# Patient Record
Sex: Female | Born: 1960 | Hispanic: No | Marital: Married | Smoking: Never smoker
Health system: Southern US, Community
[De-identification: ages and names within clinical notes are randomized; demographics above are authoritative.]

---

## 2004-03-15 ENCOUNTER — Other Ambulatory Visit: Admission: RE | Admit: 2004-03-15 | Discharge: 2004-03-15 | Payer: Self-pay | Admitting: Family Medicine

## 2004-03-30 ENCOUNTER — Encounter: Admission: RE | Admit: 2004-03-30 | Discharge: 2004-03-30 | Payer: Self-pay | Admitting: Family Medicine

## 2004-12-11 ENCOUNTER — Encounter (INDEPENDENT_AMBULATORY_CARE_PROVIDER_SITE_OTHER): Payer: Self-pay | Admitting: *Deleted

## 2004-12-11 ENCOUNTER — Inpatient Hospital Stay (HOSPITAL_COMMUNITY): Admission: RE | Admit: 2004-12-11 | Discharge: 2004-12-13 | Payer: Self-pay | Admitting: Obstetrics and Gynecology

## 2007-10-28 ENCOUNTER — Other Ambulatory Visit: Admission: RE | Admit: 2007-10-28 | Discharge: 2007-10-28 | Payer: Self-pay | Admitting: Internal Medicine

## 2007-10-30 ENCOUNTER — Encounter: Admission: RE | Admit: 2007-10-30 | Discharge: 2007-10-30 | Payer: Self-pay | Admitting: Internal Medicine

## 2007-10-30 IMAGING — MG MM SCREEN MAMMOGRAM BILATERAL
4 series · 4 of 4 positions shown · non-contrast
Comparison: none

DG SCREEN MAMMOGRAM BILATERAL
Bilateral CC and MLO view(s) were taken.

DIGITAL SCREENING MAMMOGRAM WITH CAD:
There are scattered fibroglandular densities.  No masses or malignant type calcifications are 
identified.  Compared with prior studies.

[R CC]
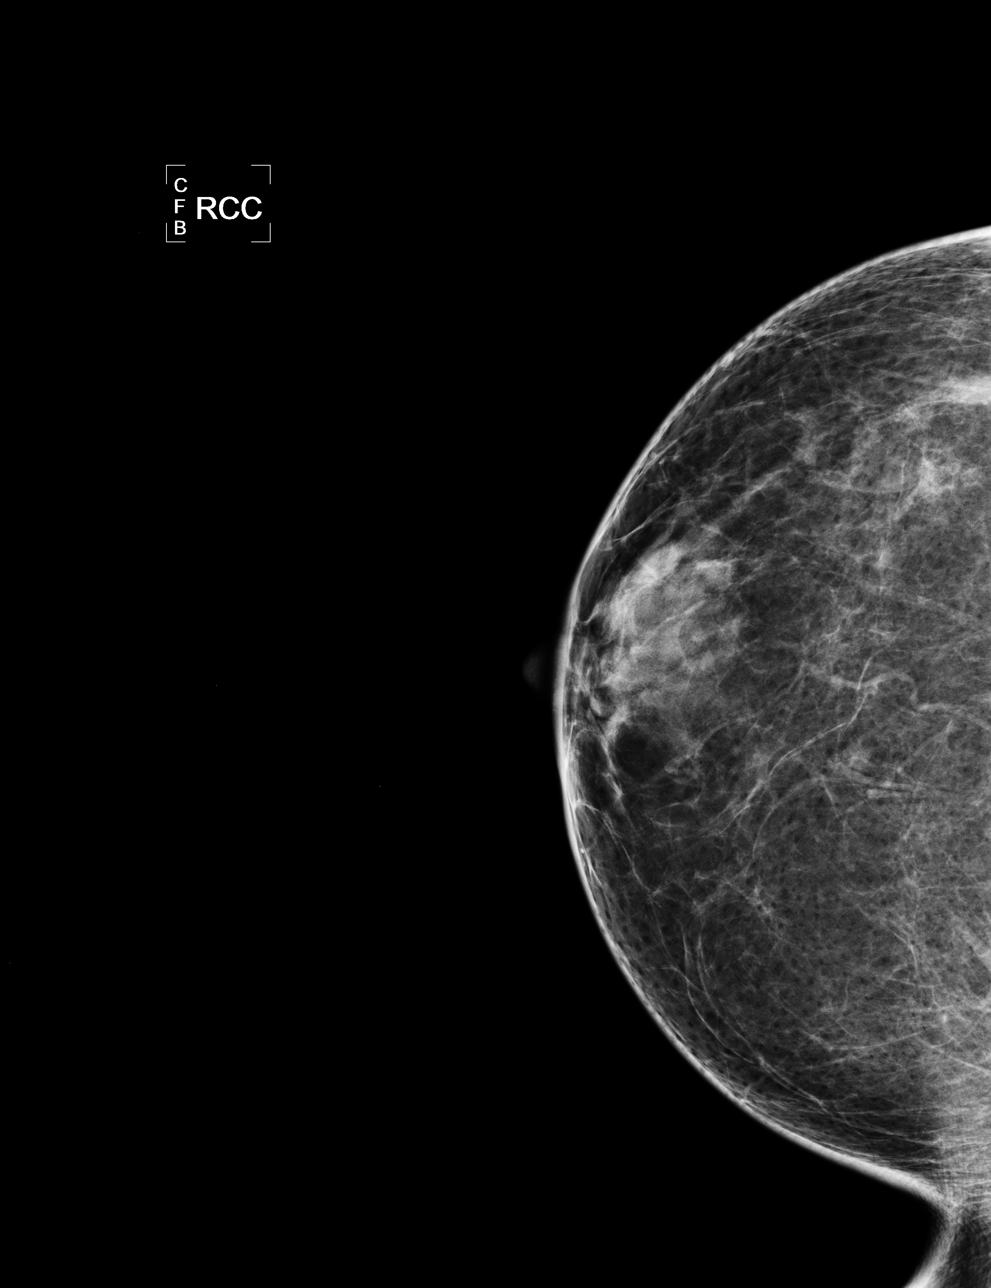

[L CC]
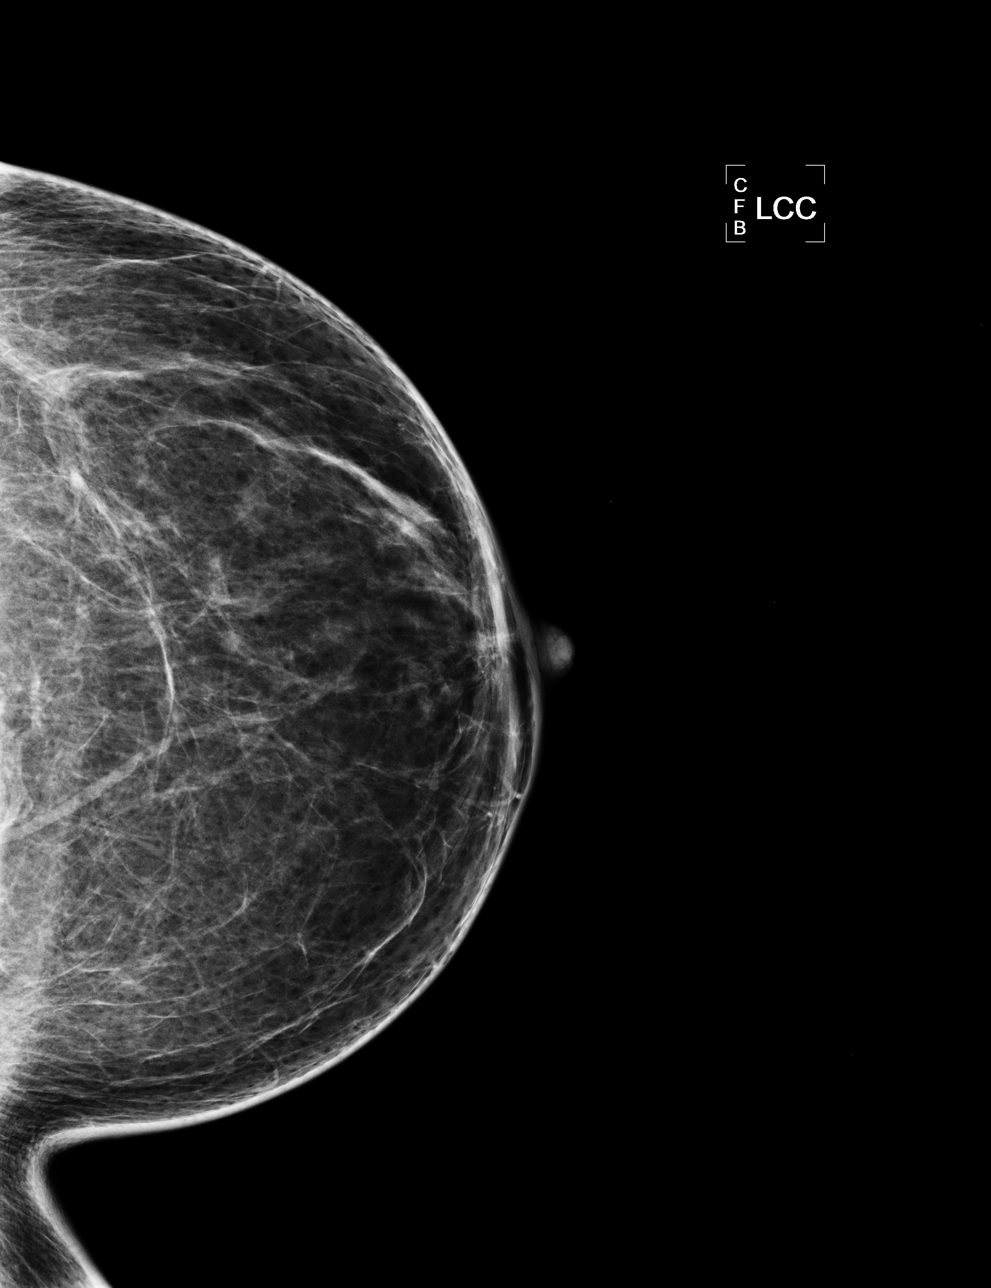

[L MLO]
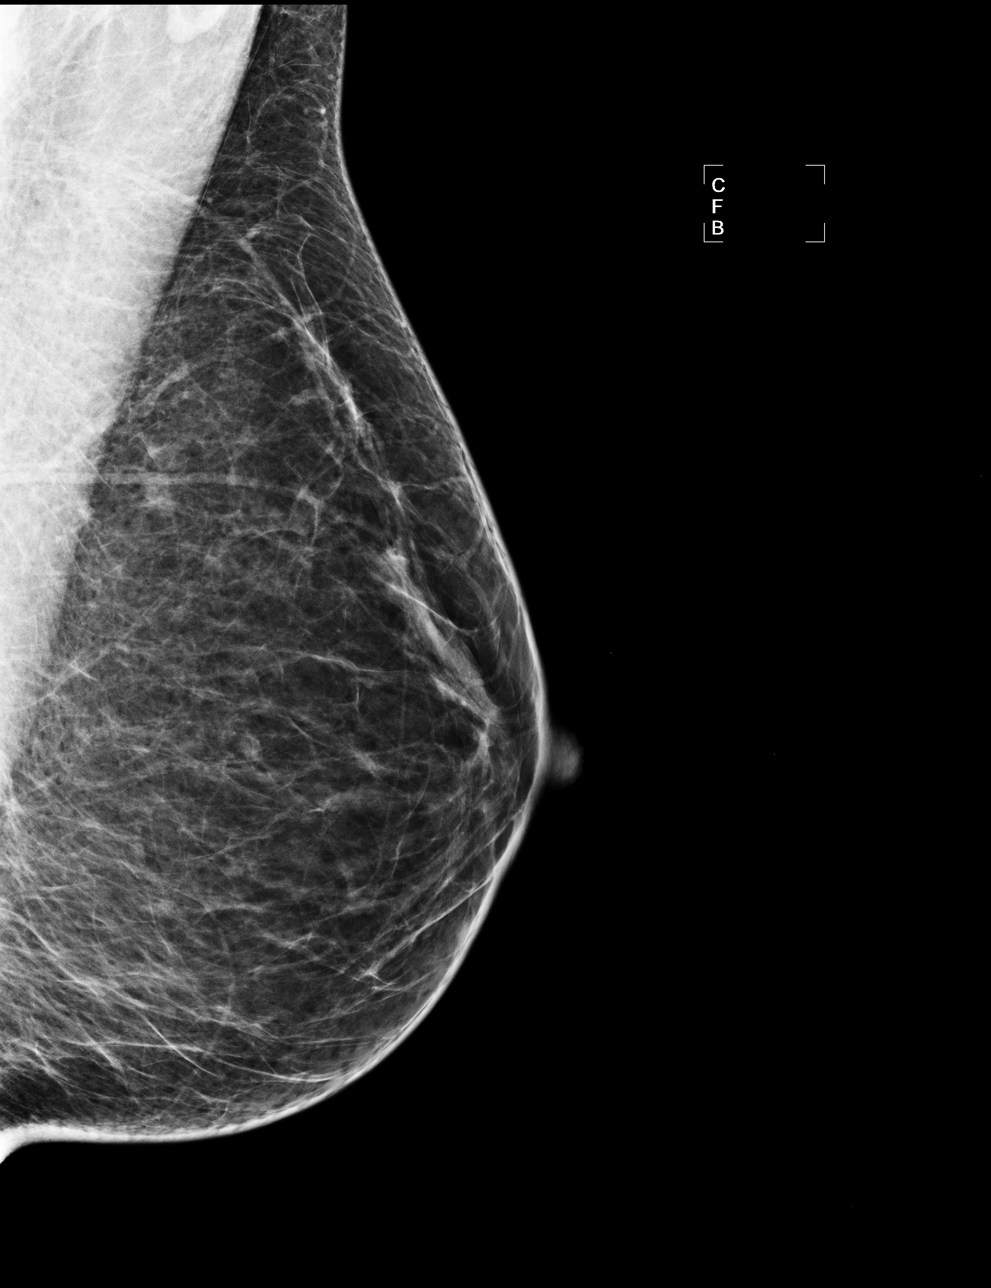

[R MLO]
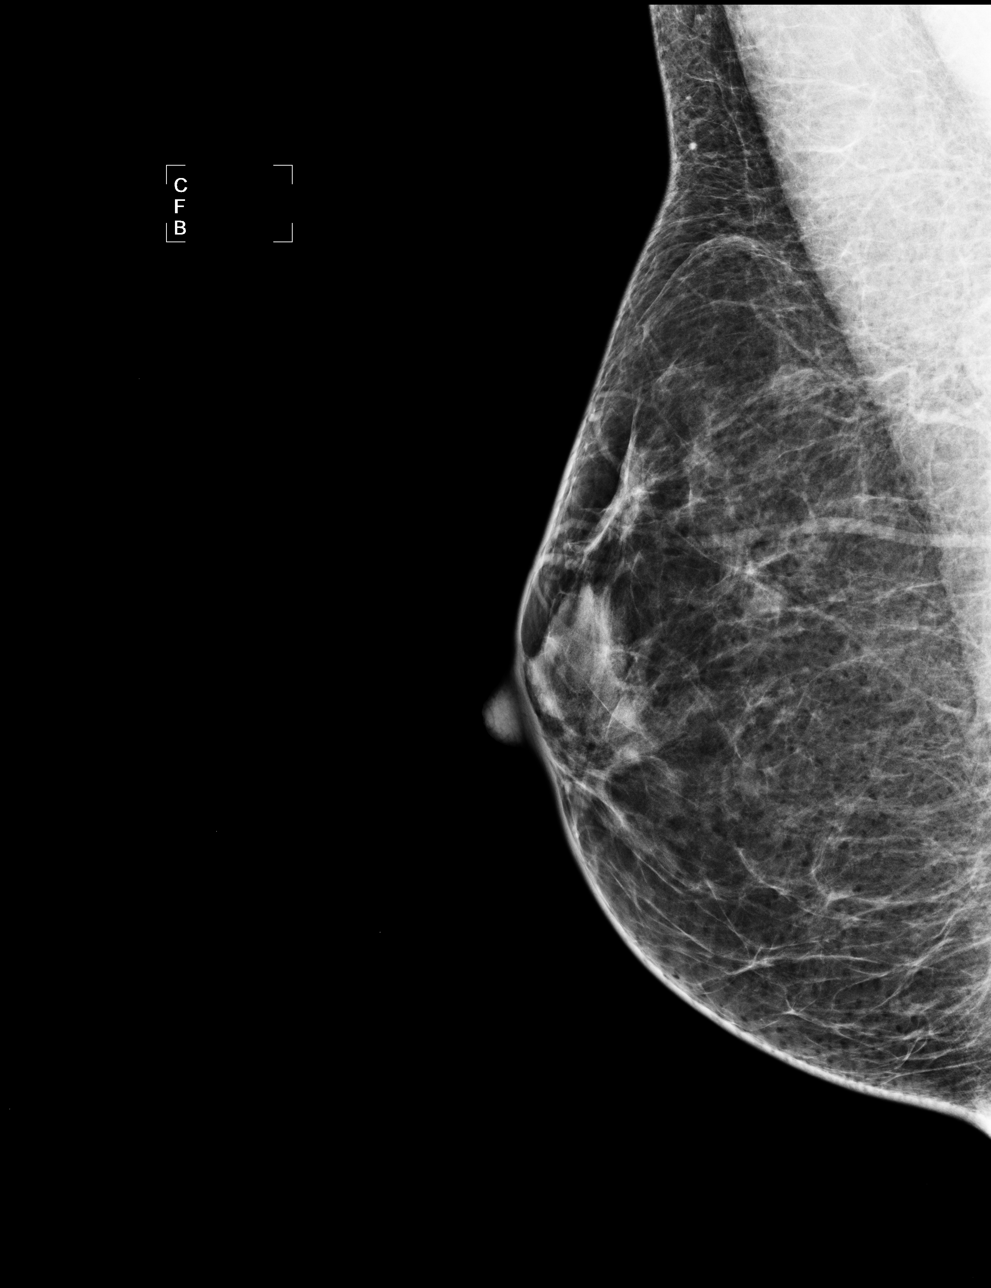

[4 of 4 positions shown; findings below may reference images not displayed]

IMPRESSION: No specific mammographic evidence of malignancy.  Next screening mammogram is recommended in one 
year.

ASSESSMENT: Negative - BI-RADS 1

Screening mammogram in 1 year.
ANALYZED BY COMPUTER AIDED DETECTION. , THIS PROCEDURE WAS A DIGITAL MAMMOGRAM.

## 2008-10-28 ENCOUNTER — Other Ambulatory Visit: Admission: RE | Admit: 2008-10-28 | Discharge: 2008-10-28 | Payer: Self-pay | Admitting: Internal Medicine

## 2010-09-10 ENCOUNTER — Encounter: Payer: Self-pay | Admitting: Internal Medicine

## 2011-01-05 NOTE — Discharge Summary (Signed)
[name], [name]            ACCOUNT NO.:  [id number]   MEDICAL RECORD NO.:  [id number]          PATIENT TYPE:  INP   LOCATION:  9302                          [facility]ACILITY:  [facility]   PHYSICIAN:  Charles A. Delcambre, MDDATE OF BIRTH:  [birth year]   DATE OF ADMISSION:  12/11/2004  DATE OF DISCHARGE:  12/13/2004                                 DISCHARGE SUMMARY   DISCHARGE DIAGNOSIS:  Uterine leiomyomata, 20 weeks size.   PROCEDURE:  Transabdominal subtotal supracervical hysterectomy, left  salpingo-oophorectomy.   DISPOSITION:  The patient is discharged home to follow up in the office in  72 hours for discontinuation of staples.  She was given convalescent  instructions to notify us for any temperature over 100 degrees, incisional  drainage, or erythema.  No lifting greater than 25 pounds for 1 month, no  driving for 2 weeks.   DISCHARGE MEDICATIONS:  1.  Prescription for Percocet 5/325 one to two p.o. q.4h p.r.n. #40.  2.  Chromagen one p.o. daily #30.   POSTOPERATIVE LABORATORY:  Pathology returned as noted above.  Benign  findings.  Uterine leiomyomata.  Hemoglobin 10.2, hematocrit 31.0.   HISTORY OF PRESENT ILLNESS:  History and physical is dictated and on the  chart.   HOSPITAL COURSE:  The patient was admitted and underwent surgery as noted  above.  Postoperatively, the patient had routine benign postoperative  course.  Catheter was discontinued on postoperative day #1.  She voided  without difficulty and flatus returned on postoperative day #1.  She was  given general diet.  She continued to do well on postoperative day #2 and  desired strongly to go home.  She tolerated pain medication and pain was  controlled.  She voided without difficulty.  She tolerated general diet.  She was ambulating and was therefore discharged home as noted above on  postoperative day #2.      CAD/MEDQ  D:  01/02/2005  T:  01/03/2005  Job:  [id number]

## 2011-01-05 NOTE — H&P (Signed)
NAMERENNIE, HACK NO.:  [id number]   MEDICAL RECORD NO.:  [id number]           PATIENT TYPE:   LOCATION:                                 FACILITY:   PHYSICIAN:  Charles A. Delcambre, MDDATE OF BIRTH:  04-Jan-1961   DATE OF ADMISSION:  DATE OF DISCHARGE:                                HISTORY & PHYSICAL   CHIEF COMPLAINT:  Large uterine fibroids with pelvic pressure, constipation,  bloating, and a large abdominal-pelvic mass.   HISTORY OF PRESENT ILLNESS:  A 50 year old para 2-0-0-2, referred initially  from Dr. Carmel Sacramento at [facility], now at [facility] at [place], with  the above-noted symptoms, much worse over the last several months, now  having trouble even zipping up her pants and now being able to feel the mass  in her abdomen.  No bleeding abnormalities but symptomatic problems as  noted, with good documented mass on a CT scan and an MRI.  Pap has been  normal.   PAST MEDICAL HISTORY:  Exercised-induced asthma and allergies, celiac  disease with dietary changes controlling.  Some diarrhea and constipation.   PAST SURGICAL HISTORY:  Lipoma removal, not specifying the region.   MEDICATIONS:  None.   ALLERGIES:  No known drug allergies.   SOCIAL HISTORY:  Occasional wine or liquor once a week.  No tobacco, ethanol  or drug use.  She is married, in a monogamous relationship with her husband.  She is a Educational psychologist at [employer].   FAMILY HISTORY:  Father is 65, with prostate cancer and obesity.  Mother is  46, in good health.  Sister 9, in good health.  Sister 63, with obesity.  Sister 31, with asthma and overweight.  Bother 31,  in good health.  Otherwise negative family history.   REVIEW OF SYSTEMS:  Negative general review.  No chest pain, shortness of  breath or wheezing.  No diarrhea, constipation or bleeding at this time.  No  urgency, frequency or dysuria or bleeding per rectum.   PHYSICAL EXAMINATION:  GENERAL:  Alert and  oriented x3, no distress.  VITAL SIGNS:  Blood pressure 98/62, respirations 16, afebrile, weight 115  pounds.  HEENT:  Grossly within normal limits.  NECK:  Supple without thyromegaly or adenopathy.  CHEST: Lungs clear bilaterally.  CARDIAC:  Regular rate and rhythm without murmur, rub, or gallop.  BREASTS:  Symmetrical, otherwise deferred.  ABDOMEN:  Soft, flat and nontender.  A palpable mass up to the umbilicus,  firm, nontender, mobile.  PELVIC:  Normal external genitalia.  Bartholin's, urethra and Skene's within  normal limits.  Vulva without discharge or lesions.  Bimanual examination:  Mass palpable up to the umbilicus, consistent with large uterine  leiomyomata, irregular, mobile.  Adnexa indeterminate secondary to the mass.   ASSESSMENT:  1.  Pelvic mass, 79.3.  2.  Uterine fibroids, 218.9.   PLAN:  Transabdominal hysterectomy.  The patient elects supracervical  hysterectomy, or subtotal hysterectomy.  In this regard, ovarian  preservation is desired as well.  She accepts risks of infection, bleeding,  bowel and bladder damage, ureteral damage, blood product risk including  hepatitis and HIV exposure, DVT risk, incisional infection, fistula  possibility.  All questions are answered and anesthesia risks were generally  discussed as well.  We will proceed as outlined, n.p.o. past midnight is  instructed.  We will have her undergo my standard bowel prep with GoLYTELY,  preoperative CBC, Chem-7, SCDs and Cefotan 1 g or equivalent will be given.  A chest x-ray or EKG per anesthesia will be done.  All questions were  answered.  Serum pregnancy test will be done as well.  We will proceed as  outlined. Gluten-free diet while hospitalized.      CAD/MEDQ  D:  12/04/2004  T:  12/04/2004  Job:  [id number]

## 2011-01-05 NOTE — Op Note (Signed)
NAMEWINFRED, REDEL            ACCOUNT NO.:  [id number]   MEDICAL RECORD NO.:  [id number]          PATIENT TYPE:  AMB   LOCATION:  SDC                           [facility]ILITY:  [facility]   PHYSICIAN:  Charles A. Delcambre, MDDATE OF BIRTH:  1960/12/21   DATE OF PROCEDURE:  12/11/2004  DATE OF DISCHARGE:                                 OPERATIVE REPORT   PREOPERATIVE DIAGNOSES:  20 week fibroids.   POSTOPERATIVE DIAGNOSES:  20 week fibroids.   PROCEDURE:  1.  Transabdominal subtotal supracervical hysterectomy.  2.  Left salpingo-oophorectomy.   SURGEON:  Charles A. Sydnee Cabal, MD   ASSISTANT:  Rudy Jew. Ashley Royalty, M.D.   COMPLICATIONS:  Some bleeding encountered in dissecting the fibroids off the  left uteroovarian pedicle with bleeding, therefore left salpingo-  oophorectomy done to achieve hemostasis.   SPECIMENS:  Uterus and left tube and ovary to pathology.   COUNTS:  Instrument, sponge and needle count correct x2.   ANESTHESIA:  General via the endotracheal route.   FINDINGS:  A 20 week uterus with multiple uterine fibroids, normal appearing  tubes and ovaries bilaterally, normal pelvis otherwise.   DESCRIPTION OF PROCEDURE:  The patient was taken to the operating room,  placed in the supine position, general anesthetic was induced without  difficulty, sterile prep and drape was undertaken. A vertical skin incision  was made with a knife up to the umbilicus and carried from the symphysis  pubis. This was carried down to the fascia, the fascia was incised with the  knife and Mayo scissors. The rectus muscles were sharply dissected in the  midline, peritoneum was entered with Metzenbaum scissors without damage to  the bowel, bladder or vascular structures. A Balfour retractor was placed,  moistened laps x3 were used to pack the bowel under the pelvis, cornual  regions were grasped with Kelly clamps, round ligaments were opened  bilaterally exposing the broad ligaments. These  were opened. The  uteroovarian pedicles were isolated on either side. On this left, this was  taken, free tied, transfixion stitch, bleeding was encountered and this was  taken back to include the ovary in order to achieve hemostasis. The ovary  was carefully isolated on the infundibulopelvic ligament pedicle well away  from the ureter which was seen clearly in the posterior wall of the pelvis  through the peritoneum while clear. Free tie and transfixion stitch of #0  Vicryl yielded good hemostasis after removal of the ovary and tube. After  taking the uteroovarian pedicle on the right, the lower uterine segment was  cleared with the bladder dropping down with some sharp dissection and blunt  dissection. The uterine vessels were taken on either side. This allowed  adequate exposure of the lower uterine segment and the cervix. The cervix  was short, lower uterine segment was short. About a centimeter and a half of  cardinal ligaments and uterosacral ligaments remained with the cervix. The  cervix was amputated from the corpus of the uterus at this point as the  patient desired supracervical hysterectomy. Judgment was made at this time  to leave the remainder of the cervix.  Zero Vicryl running figure-of-eight  sutures were used to close the cervical stump. Irrigation was carried out in  all areas of the cervical stump and pedicles were of good hemostasis. All  packs and retractors were removed. Irrigation and hemostasis was verified  once again. Subfascial hemostasis was excellent. The fascia and peritoneum  were closed en block with #0 PDS running nonlocking suture. Sterile skin  clips were applied to close the skin after minor electrocautery in the  subcutaneous layer with irrigation. A sterile dressing was applied. The  patient tolerated the procedure well and was taken to recovery with  physician in attendance.      CAD/MEDQ  D:  12/11/2004  T:  12/11/2004  Job:  [id number]

## 2011-09-18 ENCOUNTER — Other Ambulatory Visit: Payer: Self-pay | Admitting: Nurse Practitioner

## 2011-09-18 ENCOUNTER — Other Ambulatory Visit (HOSPITAL_COMMUNITY)
Admission: RE | Admit: 2011-09-18 | Discharge: 2011-09-18 | Disposition: A | Discharge status: Home / Self Care | Payer: BC Managed Care – PPO | Source: Ambulatory Visit | Attending: Obstetrics and Gynecology | Admitting: Obstetrics and Gynecology

## 2011-09-18 DIAGNOSIS — Z01419 Encounter for gynecological examination (general) (routine) without abnormal findings: Secondary | ICD-10-CM | POA: Insufficient documentation

## 2011-09-18 DIAGNOSIS — N760 Acute vaginitis: Secondary | ICD-10-CM | POA: Insufficient documentation

## 2011-09-18 DIAGNOSIS — R87810 Cervical high risk human papillomavirus (HPV) DNA test positive: Secondary | ICD-10-CM | POA: Insufficient documentation

## 2011-09-25 ENCOUNTER — Other Ambulatory Visit: Payer: Self-pay | Admitting: Nurse Practitioner

## 2011-09-25 DIAGNOSIS — Z139 Encounter for screening, unspecified: Secondary | ICD-10-CM

## 2011-10-10 ENCOUNTER — Ambulatory Visit
Admission: RE | Admit: 2011-10-10 | Discharge: 2011-10-10 | Disposition: A | Discharge status: Home / Self Care | Payer: BC Managed Care – PPO | Source: Ambulatory Visit | Attending: Nurse Practitioner | Admitting: Nurse Practitioner

## 2011-10-10 DIAGNOSIS — Z139 Encounter for screening, unspecified: Secondary | ICD-10-CM

## 2012-11-04 ENCOUNTER — Other Ambulatory Visit (HOSPITAL_COMMUNITY)
Admission: RE | Admit: 2012-11-04 | Discharge: 2012-11-04 | Disposition: A | Discharge status: Home / Self Care | Payer: BC Managed Care – PPO | Source: Ambulatory Visit | Attending: Obstetrics and Gynecology | Admitting: Obstetrics and Gynecology

## 2012-11-04 ENCOUNTER — Other Ambulatory Visit: Payer: Self-pay | Admitting: Nurse Practitioner

## 2012-11-04 DIAGNOSIS — Z1151 Encounter for screening for human papillomavirus (HPV): Secondary | ICD-10-CM | POA: Insufficient documentation

## 2012-11-04 DIAGNOSIS — Z01419 Encounter for gynecological examination (general) (routine) without abnormal findings: Secondary | ICD-10-CM | POA: Insufficient documentation

## 2012-11-04 DIAGNOSIS — N760 Acute vaginitis: Secondary | ICD-10-CM | POA: Insufficient documentation

## 2015-06-08 ENCOUNTER — Other Ambulatory Visit (HOSPITAL_COMMUNITY)
Admission: RE | Admit: 2015-06-08 | Discharge: 2015-06-08 | Disposition: A | Discharge status: Home / Self Care | Payer: BLUE CROSS/BLUE SHIELD | Source: Ambulatory Visit | Attending: Internal Medicine | Admitting: Internal Medicine

## 2015-06-08 DIAGNOSIS — Z01419 Encounter for gynecological examination (general) (routine) without abnormal findings: Secondary | ICD-10-CM | POA: Insufficient documentation

## 2016-01-13 DIAGNOSIS — N390 Urinary tract infection, site not specified: Secondary | ICD-10-CM | POA: Diagnosis not present

## 2016-01-13 DIAGNOSIS — R3915 Urgency of urination: Secondary | ICD-10-CM | POA: Diagnosis not present

## 2016-01-23 DIAGNOSIS — G4700 Insomnia, unspecified: Secondary | ICD-10-CM | POA: Diagnosis not present

## 2016-01-23 DIAGNOSIS — Z79899 Other long term (current) drug therapy: Secondary | ICD-10-CM | POA: Diagnosis not present

## 2016-01-23 DIAGNOSIS — M0600 Rheumatoid arthritis without rheumatoid factor, unspecified site: Secondary | ICD-10-CM | POA: Diagnosis not present

## 2016-01-23 DIAGNOSIS — M3500 Sicca syndrome, unspecified: Secondary | ICD-10-CM | POA: Diagnosis not present

## 2016-01-26 DIAGNOSIS — M0600 Rheumatoid arthritis without rheumatoid factor, unspecified site: Secondary | ICD-10-CM | POA: Diagnosis not present

## 2016-04-24 DIAGNOSIS — G4700 Insomnia, unspecified: Secondary | ICD-10-CM | POA: Diagnosis not present

## 2016-04-24 DIAGNOSIS — Z79899 Other long term (current) drug therapy: Secondary | ICD-10-CM | POA: Diagnosis not present

## 2016-04-24 DIAGNOSIS — M3500 Sicca syndrome, unspecified: Secondary | ICD-10-CM | POA: Diagnosis not present

## 2016-04-24 DIAGNOSIS — M0600 Rheumatoid arthritis without rheumatoid factor, unspecified site: Secondary | ICD-10-CM | POA: Diagnosis not present

## 2016-05-11 DIAGNOSIS — N390 Urinary tract infection, site not specified: Secondary | ICD-10-CM | POA: Diagnosis not present

## 2016-06-11 DIAGNOSIS — Z01419 Encounter for gynecological examination (general) (routine) without abnormal findings: Secondary | ICD-10-CM | POA: Diagnosis not present

## 2016-06-11 DIAGNOSIS — N898 Other specified noninflammatory disorders of vagina: Secondary | ICD-10-CM | POA: Diagnosis not present

## 2016-06-11 DIAGNOSIS — Z780 Asymptomatic menopausal state: Secondary | ICD-10-CM | POA: Diagnosis not present

## 2016-06-12 DIAGNOSIS — Z0000 Encounter for general adult medical examination without abnormal findings: Secondary | ICD-10-CM | POA: Diagnosis not present

## 2016-06-12 DIAGNOSIS — N390 Urinary tract infection, site not specified: Secondary | ICD-10-CM | POA: Diagnosis not present

## 2016-06-12 DIAGNOSIS — K900 Celiac disease: Secondary | ICD-10-CM | POA: Diagnosis not present

## 2016-06-12 DIAGNOSIS — E559 Vitamin D deficiency, unspecified: Secondary | ICD-10-CM | POA: Diagnosis not present

## 2016-06-12 DIAGNOSIS — M3500 Sicca syndrome, unspecified: Secondary | ICD-10-CM | POA: Diagnosis not present

## 2016-06-19 ENCOUNTER — Other Ambulatory Visit: Payer: Self-pay | Admitting: Internal Medicine

## 2016-06-19 DIAGNOSIS — Z1231 Encounter for screening mammogram for malignant neoplasm of breast: Secondary | ICD-10-CM

## 2016-06-21 DIAGNOSIS — M064 Inflammatory polyarthropathy: Secondary | ICD-10-CM | POA: Diagnosis not present

## 2016-06-21 DIAGNOSIS — K900 Celiac disease: Secondary | ICD-10-CM | POA: Diagnosis not present

## 2016-06-21 DIAGNOSIS — M3500 Sicca syndrome, unspecified: Secondary | ICD-10-CM | POA: Diagnosis not present

## 2016-06-21 DIAGNOSIS — Z23 Encounter for immunization: Secondary | ICD-10-CM | POA: Diagnosis not present

## 2016-06-21 DIAGNOSIS — Z0000 Encounter for general adult medical examination without abnormal findings: Secondary | ICD-10-CM | POA: Diagnosis not present

## 2016-07-04 ENCOUNTER — Ambulatory Visit
Admission: RE | Admit: 2016-07-04 | Discharge: 2016-07-04 | Disposition: A | Discharge status: Home / Self Care | Payer: BLUE CROSS/BLUE SHIELD | Source: Ambulatory Visit | Attending: Internal Medicine | Admitting: Internal Medicine

## 2016-07-04 DIAGNOSIS — Z1231 Encounter for screening mammogram for malignant neoplasm of breast: Secondary | ICD-10-CM | POA: Diagnosis not present

## 2016-09-03 DIAGNOSIS — M0600 Rheumatoid arthritis without rheumatoid factor, unspecified site: Secondary | ICD-10-CM | POA: Diagnosis not present

## 2016-09-03 DIAGNOSIS — Z79899 Other long term (current) drug therapy: Secondary | ICD-10-CM | POA: Diagnosis not present

## 2016-09-03 DIAGNOSIS — M3500 Sicca syndrome, unspecified: Secondary | ICD-10-CM | POA: Diagnosis not present

## 2016-10-02 DIAGNOSIS — N390 Urinary tract infection, site not specified: Secondary | ICD-10-CM | POA: Diagnosis not present

## 2016-10-03 DIAGNOSIS — H35413 Lattice degeneration of retina, bilateral: Secondary | ICD-10-CM | POA: Diagnosis not present

## 2016-10-03 DIAGNOSIS — H40013 Open angle with borderline findings, low risk, bilateral: Secondary | ICD-10-CM | POA: Diagnosis not present

## 2016-10-03 DIAGNOSIS — H532 Diplopia: Secondary | ICD-10-CM | POA: Diagnosis not present

## 2016-10-03 DIAGNOSIS — Z79899 Other long term (current) drug therapy: Secondary | ICD-10-CM | POA: Diagnosis not present

## 2016-11-30 DIAGNOSIS — R42 Dizziness and giddiness: Secondary | ICD-10-CM | POA: Diagnosis not present

## 2016-11-30 DIAGNOSIS — M19072 Primary osteoarthritis, left ankle and foot: Secondary | ICD-10-CM | POA: Diagnosis not present

## 2016-11-30 DIAGNOSIS — M0600 Rheumatoid arthritis without rheumatoid factor, unspecified site: Secondary | ICD-10-CM | POA: Diagnosis not present

## 2016-11-30 DIAGNOSIS — M3500 Sicca syndrome, unspecified: Secondary | ICD-10-CM | POA: Diagnosis not present

## 2016-11-30 DIAGNOSIS — M79671 Pain in right foot: Secondary | ICD-10-CM | POA: Diagnosis not present

## 2016-11-30 DIAGNOSIS — Z79899 Other long term (current) drug therapy: Secondary | ICD-10-CM | POA: Diagnosis not present

## 2016-11-30 DIAGNOSIS — M19071 Primary osteoarthritis, right ankle and foot: Secondary | ICD-10-CM | POA: Diagnosis not present

## 2016-12-17 ENCOUNTER — Ambulatory Visit (INDEPENDENT_AMBULATORY_CARE_PROVIDER_SITE_OTHER): Payer: BC Managed Care – PPO | Admitting: Podiatry

## 2016-12-17 ENCOUNTER — Ambulatory Visit (INDEPENDENT_AMBULATORY_CARE_PROVIDER_SITE_OTHER): Payer: BC Managed Care – PPO

## 2016-12-17 ENCOUNTER — Encounter: Payer: Self-pay | Admitting: Podiatry

## 2016-12-17 VITALS — BP 102/67 | HR 66 | Resp 16 | Ht 62.0 in | Wt 115.0 lb

## 2016-12-17 DIAGNOSIS — M2011 Hallux valgus (acquired), right foot: Secondary | ICD-10-CM | POA: Diagnosis not present

## 2016-12-17 DIAGNOSIS — G8929 Other chronic pain: Secondary | ICD-10-CM

## 2016-12-17 DIAGNOSIS — M25572 Pain in left ankle and joints of left foot: Secondary | ICD-10-CM | POA: Diagnosis not present

## 2016-12-17 DIAGNOSIS — M25571 Pain in right ankle and joints of right foot: Secondary | ICD-10-CM | POA: Diagnosis not present

## 2016-12-17 DIAGNOSIS — M779 Enthesopathy, unspecified: Secondary | ICD-10-CM

## 2016-12-17 DIAGNOSIS — M2012 Hallux valgus (acquired), left foot: Secondary | ICD-10-CM

## 2016-12-17 DIAGNOSIS — M2022 Hallux rigidus, left foot: Secondary | ICD-10-CM | POA: Diagnosis not present

## 2016-12-17 NOTE — Progress Notes (Signed)
Subjective:    Patient ID: Rose Tran, female   DOB: 56 y.o.   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 States this is been going on for years and getting worse    Review of Systems  All other systems reviewed and are negative.       Objective:  Physical Exam  Constitutional: She is oriented to person, place, and time.  Cardiovascular: Intact distal pulses.   Musculoskeletal: Normal range of motion.  Neurological: She is alert and oriented to person, place, and time.  Skin: Skin is warm.  Nursing note and vitals reviewed.  neurovascular status found to be intact muscle strength adequate range of motion within normal limits with patient found to have significant range of motion loss first MPJ left with exquisite discomfort on dorsiflexion and large dorsal spur formation. The right first MPJ shows bunion deformity with good range of motion and no indications of hallux limitus deformity. 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 At this point I do think this patient will require surgery in the left due to the severity of pain infiltrated spot previous injection and I discussed the probability for implant or fusion versus being able to reconstruct the joint. For the right I do not recommend treatment and I did go ahead today and I made orthotics for this patient as she is going to  [place] for the summer and we want to try to give her as much relief as we can prior to the trip and she can have surgery towards the end of August  X-rays indicate severe arthritis first MPJ left with dorsal spurring and significant loss of joint space with moderate bunion deformity right

## 2016-12-17 NOTE — Progress Notes (Signed)
   Subjective:    Patient ID: Rose Tran, female    DOB: 24-Nov-1960, 56 y.o.   MRN: [id number]  HPI Chief Complaint  Patient presents with  . 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 Ankle Pain    Bilateral; both sides; pt stated, "has had pain since 2011"  . Foot Orthotics    Wants to discuss      Review of Systems  Constitutional: Positive for fatigue.  HENT: Positive for sinus pain.   Eyes: Positive for itching.  Endocrine: Positive for cold intolerance.  Musculoskeletal: Positive for arthralgias and back pain.  Allergic/Immunologic: Positive for food allergies.  Neurological: Positive for dizziness and headaches.  Hematological: Bruises/bleeds easily.  All other systems reviewed and are negative.      Objective:   Physical Exam        Assessment & Plan:

## 2017-04-08 ENCOUNTER — Ambulatory Visit (INDEPENDENT_AMBULATORY_CARE_PROVIDER_SITE_OTHER): Payer: BC Managed Care – PPO | Admitting: Podiatry

## 2017-04-08 DIAGNOSIS — M2012 Hallux valgus (acquired), left foot: Secondary | ICD-10-CM | POA: Diagnosis not present

## 2017-04-08 DIAGNOSIS — M2022 Hallux rigidus, left foot: Secondary | ICD-10-CM | POA: Diagnosis not present

## 2017-04-08 DIAGNOSIS — M2011 Hallux valgus (acquired), right foot: Secondary | ICD-10-CM | POA: Diagnosis not present

## 2017-04-08 NOTE — Patient Instructions (Signed)
Pre-Operative Instructions  Congratulations, you have decided to take an important step towards improving your quality of life.  You can be assured that the doctors and staff at [facility] will be with you every step of the way.  Here are some important things you should know:  1. Plan to be at the surgery center/hospital at least 1 (one) hour prior to your scheduled time, unless otherwise directed by the surgical center/hospital staff.  You must have a responsible adult accompany you, remain during the surgery and drive you home.  Make sure you have directions to the surgical center/hospital to ensure you arrive on time. 2. If you are having surgery at [facility] or [facility] hospitals, you will need a copy of your medical history and physical form from your family physician within one month prior to the date of surgery. We will give you a form for your primary physician to complete.  3. We make every effort to accommodate the date you request for surgery.  However, there are times where surgery dates or times have to be moved.  We will contact you as soon as possible if a change in schedule is required.   4. No aspirin/ibuprofen for one week before surgery.  If you are on aspirin, any non-steroidal anti-inflammatory medications (Mobic, Aleve, Ibuprofen) should not be taken seven (7) days prior to your surgery.  You make take Tylenol for pain prior to surgery.  5. Medications - If you are taking daily heart and blood pressure medications, seizure, reflux, allergy, asthma, anxiety, pain or diabetes medications, make sure you notify the surgery center/hospital before the day of surgery so they can tell you which medications you should take or avoid the day of surgery. 6. No food or drink after midnight the night before surgery unless directed otherwise by surgical center/hospital staff. 7. No alcoholic beverages 24-hours prior to surgery.  No smoking 24-hours prior or 24-hours after  surgery. 8. Wear loose pants or shorts. They should be loose enough to fit over bandages, boots, and casts. 9. Don't wear slip-on shoes. Sneakers are preferred. 10. Bring your boot with you to the surgery center/hospital.  Also bring crutches or a walker if your physician has prescribed it for you.  If you do not have this equipment, it will be provided for you after surgery. 11. If you have not been contacted by the surgery center/hospital by the day before your surgery, call to confirm the date and time of your surgery. 12. Leave-time from work may vary depending on the type of surgery you have.  Appropriate arrangements should be made prior to surgery with your employer. 13. Prescriptions will be provided immediately following surgery by your doctor.  Fill these as soon as possible after surgery and take the medication as directed. Pain medications will not be refilled on weekends and must be approved by the doctor. 14. Remove nail polish on the operative foot and avoid getting pedicures prior to surgery. 15. Wash the night before surgery.  The night before surgery wash the foot and leg well with water and the antibacterial soap provided. Be sure to pay special attention to beneath the toenails and in between the toes.  Wash for at least three (3) minutes. Rinse thoroughly with water and dry well with a towel.  Perform this wash unless told not to do so by your physician.  Enclosed: 1 Ice pack (please put in freezer the night before surgery)   1 Hibiclens skin cleaner     Pre-op instructions  If you have any questions regarding the instructions, please do not hesitate to call our office.  [place]: [address], [place], [place] [address] -- [phone]  [place]: [address] [place], [place] [address] -- [phone]  [place]: [address]  [place], [place] [address] -- [phone]  [place]: [address], [address], [place], [place] [address] -- [phone]  Website:  [email or web address] 

## 2017-04-10 NOTE — Progress Notes (Signed)
Subjective:    Patient ID: Rose Tran, female   DOB: 56 y.o.   MRN: [id number]   HPI patient presents for consultation concerning chronic limitus condition of the left big toe joint stating that it's been increasingly tender and making it hard for her to be ambulatory. States that she wants this surgically intervened    ROS      Objective:  Physical Exam neurovascular status intact negative Homans sign was noted with patient's left foot showing reduced range of motion with significant discomfort and radiographic changes of significant hallux limitus rigidus condition     Assessment:    Chronic hallux limitus rigidus condition left     Plan:     H&P and x-ray reviewed. I allowed her to read consent form reviewing correction and explained to her that if the cartilage is in very poor condition we'll need to consider implant but my hope is that we'll get a be able to do an osteotomy even though long-term may require implant or fusion. Patient understands this and wants surgery and at this point I allowed her to sign consent form after extensive review and she understands is no long-term guarantees and that recovery is a proximal 6 months to one year. I did dispense air fracture walker for the postoperative period and explained her usage and encouraged her to call with questions if she has knee prior to procedure

## 2017-04-16 ENCOUNTER — Encounter: Payer: Self-pay | Admitting: Podiatry

## 2017-04-16 DIAGNOSIS — K219 Gastro-esophageal reflux disease without esophagitis: Secondary | ICD-10-CM | POA: Diagnosis not present

## 2017-04-16 DIAGNOSIS — M21612 Bunion of left foot: Secondary | ICD-10-CM | POA: Diagnosis not present

## 2017-04-16 DIAGNOSIS — M2022 Hallux rigidus, left foot: Secondary | ICD-10-CM | POA: Diagnosis not present

## 2017-04-19 NOTE — Progress Notes (Signed)
DOS 04/16/17 Biplanar osteotomy 1st left with Rober MinionKwire

## 2017-04-24 DIAGNOSIS — H33321 Round hole, right eye: Secondary | ICD-10-CM | POA: Diagnosis not present

## 2017-04-24 DIAGNOSIS — H04123 Dry eye syndrome of bilateral lacrimal glands: Secondary | ICD-10-CM | POA: Diagnosis not present

## 2017-04-24 DIAGNOSIS — H35413 Lattice degeneration of retina, bilateral: Secondary | ICD-10-CM | POA: Diagnosis not present

## 2017-04-24 DIAGNOSIS — M3500 Sicca syndrome, unspecified: Secondary | ICD-10-CM | POA: Diagnosis not present

## 2017-04-24 DIAGNOSIS — H40023 Open angle with borderline findings, high risk, bilateral: Secondary | ICD-10-CM | POA: Diagnosis not present

## 2017-04-26 ENCOUNTER — Ambulatory Visit (INDEPENDENT_AMBULATORY_CARE_PROVIDER_SITE_OTHER): Payer: Self-pay | Admitting: Podiatry

## 2017-04-26 ENCOUNTER — Ambulatory Visit (INDEPENDENT_AMBULATORY_CARE_PROVIDER_SITE_OTHER): Payer: BC Managed Care – PPO

## 2017-04-26 ENCOUNTER — Ambulatory Visit: Payer: BC Managed Care – PPO

## 2017-04-26 DIAGNOSIS — M21619 Bunion of unspecified foot: Secondary | ICD-10-CM

## 2017-04-26 DIAGNOSIS — M205X2 Other deformities of toe(s) (acquired), left foot: Secondary | ICD-10-CM

## 2017-04-26 NOTE — Progress Notes (Signed)
Subjective: Rose BusingKathleen A Tran is a 56 y.o. is seen today in office s/p left 1st metatarsal osteotomy preformed on 04/16/2017 with Dr. Charlsie Merlesegal. They state their pain is minimal and she is not taking any pain medication. Straight spine and a surgical shoe and she presents today using crutches in nonweightbearing. Denies any systemic complaints such as fevers, chills, nausea, vomiting. No calf pain, chest pain, shortness of breath.   Objective: General: No acute distress, AAOx3  DP/PT pulses palpable 2/4, CRT < 3 sec to all digits.  Protective sensation intact. Motor function intact.  Left foot: Incision is well coapted without any evidence of dehiscence and sutures are intact. There is no surrounding erythema, ascending cellulitis, fluctuance, crepitus, malodor, drainage/purulence. There is mild edema around the surgical site. There is minimal pain along the surgical site. Toe is in rectus position. There is ecchymoses to the digits.  No other areas of tenderness to bilateral lower extremities.  No other open lesions or pre-ulcerative lesions.  No pain with calf compression, swelling, warmth, erythema.   Assessment and Plan:  Status post left foot surgery, doing well with no complications   -Treatment options discussed including all alternatives, risks, and complications -X-rays were obtained and reviewed with the patient. Arthrotec first metatarsal. No evidence of acute fracture. Status post shortening of standing the first metatarsal. -Antibiotic on it was applied followed by a bandage. Keep dressing clean, dry, intact. -Ice/elevation -Pain medication as needed-she has not been eating this. -Monitor for any clinical signs or symptoms of infection and DVT/PE and directed to call the office immediately should any occur or go to the ER. -Follow-up in 1 week with Dr. Charlsie Merlesegal or sooner if any problems arise. In the meantime, encouraged to call the office with any questions, concerns, change in symptoms.    Ovid CurdMatthew Jazzie Trampe, DPM

## 2017-05-03 ENCOUNTER — Ambulatory Visit: Payer: BC Managed Care – PPO

## 2017-05-03 ENCOUNTER — Encounter: Payer: Self-pay | Admitting: Podiatry

## 2017-05-03 ENCOUNTER — Ambulatory Visit (INDEPENDENT_AMBULATORY_CARE_PROVIDER_SITE_OTHER): Payer: BC Managed Care – PPO | Admitting: Podiatry

## 2017-05-03 VITALS — BP 110/64 | HR 64 | Resp 16

## 2017-05-03 DIAGNOSIS — M205X2 Other deformities of toe(s) (acquired), left foot: Secondary | ICD-10-CM | POA: Diagnosis not present

## 2017-05-03 DIAGNOSIS — M2012 Hallux valgus (acquired), left foot: Secondary | ICD-10-CM

## 2017-05-03 NOTE — Progress Notes (Signed)
Subjective:    Patient ID: Rose Tran, female   DOB: 56 y.o.   MRN: [id number]   HPI patient states she's been doing well but she is achy if she does too much    ROS      Objective:  Physical Exam neurovascular status intact negative Homans sign was noted with patient's left first MPJ healing well with wound edges well coapted and good range of motion currently     Assessment:    Doing well post osteotomy left     Plan:    Advised this patient on physical therapy supportive shoes and range of motion exercises and reviewed gradual increase in activity and dispensed surgical shoe but continue immobilization compression elevation  X-rays indicate that there is good alignment and pins are in place joint appears congruence

## 2017-05-14 DIAGNOSIS — Z79899 Other long term (current) drug therapy: Secondary | ICD-10-CM | POA: Diagnosis not present

## 2017-05-14 DIAGNOSIS — M0600 Rheumatoid arthritis without rheumatoid factor, unspecified site: Secondary | ICD-10-CM | POA: Diagnosis not present

## 2017-05-14 DIAGNOSIS — M25562 Pain in left knee: Secondary | ICD-10-CM | POA: Diagnosis not present

## 2017-05-14 DIAGNOSIS — M3500 Sicca syndrome, unspecified: Secondary | ICD-10-CM | POA: Diagnosis not present

## 2017-05-14 DIAGNOSIS — M19041 Primary osteoarthritis, right hand: Secondary | ICD-10-CM | POA: Diagnosis not present

## 2017-05-17 ENCOUNTER — Encounter: Payer: Self-pay | Admitting: Podiatry

## 2017-05-17 ENCOUNTER — Ambulatory Visit (INDEPENDENT_AMBULATORY_CARE_PROVIDER_SITE_OTHER): Payer: BC Managed Care – PPO

## 2017-05-17 ENCOUNTER — Ambulatory Visit (INDEPENDENT_AMBULATORY_CARE_PROVIDER_SITE_OTHER): Payer: BC Managed Care – PPO | Admitting: Podiatry

## 2017-05-17 DIAGNOSIS — M2022 Hallux rigidus, left foot: Secondary | ICD-10-CM | POA: Diagnosis not present

## 2017-05-17 DIAGNOSIS — M205X2 Other deformities of toe(s) (acquired), left foot: Secondary | ICD-10-CM

## 2017-05-17 NOTE — Progress Notes (Signed)
Subjective:    Patient ID: Rose Tran, female   DOB: 56 y.o.   MRN: [id number]   HPI patient states she's doing real well with her foot with minimal discomfort    ROS      Objective:  Physical Exam neurovascular status intact negative Homans sign was noted with patient's left foot healing well with wound edges well coapted good range of motion no crepitus of the joint noted and incision site that's healing well     Assessment:   Doing well post osteotomy first metatarsal left      Plan:    H&P x-rays reviewed and instructed on the importance of continued range of motion exercises gradual increase in activity and dispensed ankle compression stocking with encouragement to start wearing soft shoe gear and reappoint 4 weeks or earlier if needed  X-rays indicate the osteotomy is healing well pins are in place joint reasonably congruence with mild indications of arthritis but excellent clinical

## 2017-06-04 DIAGNOSIS — H33323 Round hole, bilateral: Secondary | ICD-10-CM | POA: Diagnosis not present

## 2017-06-04 DIAGNOSIS — H35413 Lattice degeneration of retina, bilateral: Secondary | ICD-10-CM | POA: Diagnosis not present

## 2017-06-04 DIAGNOSIS — Z79899 Other long term (current) drug therapy: Secondary | ICD-10-CM | POA: Diagnosis not present

## 2017-06-17 DIAGNOSIS — Z01419 Encounter for gynecological examination (general) (routine) without abnormal findings: Secondary | ICD-10-CM | POA: Diagnosis not present

## 2017-06-17 DIAGNOSIS — Z1151 Encounter for screening for human papillomavirus (HPV): Secondary | ICD-10-CM | POA: Diagnosis not present

## 2017-06-17 DIAGNOSIS — Z780 Asymptomatic menopausal state: Secondary | ICD-10-CM | POA: Diagnosis not present

## 2017-06-17 DIAGNOSIS — Z1212 Encounter for screening for malignant neoplasm of rectum: Secondary | ICD-10-CM | POA: Diagnosis not present

## 2017-06-24 DIAGNOSIS — Z780 Asymptomatic menopausal state: Secondary | ICD-10-CM | POA: Diagnosis not present

## 2017-06-26 ENCOUNTER — Ambulatory Visit (INDEPENDENT_AMBULATORY_CARE_PROVIDER_SITE_OTHER): Payer: BC Managed Care – PPO | Admitting: Podiatry

## 2017-06-26 ENCOUNTER — Ambulatory Visit (INDEPENDENT_AMBULATORY_CARE_PROVIDER_SITE_OTHER): Payer: BC Managed Care – PPO

## 2017-06-26 ENCOUNTER — Encounter: Payer: Self-pay | Admitting: Podiatry

## 2017-06-26 DIAGNOSIS — M205X2 Other deformities of toe(s) (acquired), left foot: Secondary | ICD-10-CM

## 2017-06-26 DIAGNOSIS — Z472 Encounter for removal of internal fixation device: Secondary | ICD-10-CM | POA: Diagnosis not present

## 2017-06-26 NOTE — Patient Instructions (Signed)
Pre-Operative Instructions  Congratulations, you have decided to take an important step towards improving your quality of life.  You can be assured that the doctors and staff at [facility] will be with you every step of the way.  Here are some important things you should know:  1. Plan to be at the surgery center/hospital at least 1 (one) hour prior to your scheduled time, unless otherwise directed by the surgical center/hospital staff.  You must have a responsible adult accompany you, remain during the surgery and drive you home.  Make sure you have directions to the surgical center/hospital to ensure you arrive on time. 2. If you are having surgery at [facility] or [facility], you will need a copy of your medical history and physical form from your family physician within one month prior to the date of surgery. We will give you a form for your primary physician to complete.  3. We make every effort to accommodate the date you request for surgery.  However, there are times where surgery dates or times have to be moved.  We will contact you as soon as possible if a change in schedule is required.   4. No aspirin/ibuprofen for one week before surgery.  If you are on aspirin, any non-steroidal anti-inflammatory medications (Mobic, Aleve, Ibuprofen) should not be taken seven (7) days prior to your surgery.  You make take Tylenol for pain prior to surgery.  5. Medications - If you are taking daily heart and blood pressure medications, seizure, reflux, allergy, asthma, anxiety, pain or diabetes medications, make sure you notify the surgery center/hospital before the day of surgery so they can tell you which medications you should take or avoid the day of surgery. 6. No food or drink after midnight the night before surgery unless directed otherwise by surgical center/hospital staff. 7. No alcoholic beverages 24-hours prior to surgery.  No smoking 24-hours prior or 24-hours after  surgery. 8. Wear loose pants or shorts. They should be loose enough to fit over bandages, boots, and casts. 9. Don't wear slip-on shoes. Sneakers are preferred. 10. Bring your boot with you to the surgery center/hospital.  Also bring crutches or a walker if your physician has prescribed it for you.  If you do not have this equipment, it will be provided for you after surgery. 11. If you have not been contacted by the surgery center/hospital by the day before your surgery, call to confirm the date and time of your surgery. 12. Leave-time from work may vary depending on the type of surgery you have.  Appropriate arrangements should be made prior to surgery with your employer. 13. Prescriptions will be provided immediately following surgery by your doctor.  Fill these as soon as possible after surgery and take the medication as directed. Pain medications will not be refilled on weekends and must be approved by the doctor. 14. Remove nail polish on the operative foot and avoid getting pedicures prior to surgery. 15. Wash the night before surgery.  The night before surgery wash the foot and leg well with water and the antibacterial soap provided. Be sure to pay special attention to beneath the toenails and in between the toes.  Wash for at least three (3) minutes. Rinse thoroughly with water and dry well with a towel.  Perform this wash unless told not to do so by your physician.  Enclosed: 1 Ice pack (please put in freezer the night before surgery)   1 Hibiclens skin cleaner     Pre-op instructions  If you have any questions regarding the instructions, please do not hesitate to call our office.  [place]: [address], [place], [place] [address] -- [phone]  [place]: [address] [place], [place] [address] -- [phone]  [place]: [address]  [place], [place] [address] -- [phone]  [place]: [address], [address], [place],  [address] -- [phone]  Website:  [email or web address] 

## 2017-06-26 NOTE — Progress Notes (Signed)
Subjective:    Patient ID: Rose BusingKathleen A Linck, female   DOB: 56 y.o.   MRN: [id number]   HPI patient states I'm doing well but this spot on top of my foot has really been sore and making it hard to wear shoe gear    ROS      Objective:  Physical Exam neurovascular status intact with well healed surgical site left first metatarsal with wound edges well coapted good range of motion and no crepitus with patient noted to have a prominent pin position on the proximal portion of the metatarsal     Assessment:    Abnormal pin position with good structure and function of the joint     Plan:    H&P condition reviewed and recommended removal of pin. I reviewed x-ray and at this time allowed her to sign consent form discussing complications risk and patient wants surgery and signs consent form. Patient is scheduled for outpatient surgery  X-ray indicates that there is an abnormal pin on the proximal portion of the metatarsal with the joint healthy and congruence

## 2017-06-28 DIAGNOSIS — H33322 Round hole, left eye: Secondary | ICD-10-CM | POA: Diagnosis not present

## 2017-07-04 ENCOUNTER — Ambulatory Visit (INDEPENDENT_AMBULATORY_CARE_PROVIDER_SITE_OTHER): Payer: BC Managed Care – PPO | Admitting: Podiatry

## 2017-07-04 ENCOUNTER — Encounter: Payer: Self-pay | Admitting: Podiatry

## 2017-07-04 VITALS — BP 100/68 | HR 65 | Temp 97.7°F | Resp 16

## 2017-07-04 DIAGNOSIS — Z472 Encounter for removal of internal fixation device: Secondary | ICD-10-CM | POA: Diagnosis not present

## 2017-07-04 NOTE — Progress Notes (Signed)
Subjective:    Patient ID: Rose Tran, female   DOB: 56 y.o.   MRN: [id number]   HPI patient presents for removal of painful and of the left first metatarsal    ROS      Objective:  Physical Exam neurovascular status intact with prominent pin position left first metatarsal that's very painful when pressed     Assessment:    Abnormal pin position left     Plan:    Brought patient back to the OR injected with 60 Milligan times like Marcaine mixture and did sterile prep of the left foot. Inflated tourniquet 250 mmHg and did Betadine soaked of the left incision site. I then did a approximate 2 cm incision over the first metatarsal proximal shaft over the prominent area and took this down to capsule with hemostasis being acquired as necessary. I then went ahead and made a small capsular incision and excised off bone and noted that there is a pin that's moderately prominent in this area. The pin was removed in toto wound was flushed Achilles might of sterile Garamycin solution was sutured with 5-0 nylon. Sterile dressing applied and patient tolerated procedure well tourniquet was released And refills noted to be immediate to all digits and patient was taken out of the OR in satisfactory condition

## 2017-07-17 ENCOUNTER — Encounter: Payer: Self-pay | Admitting: Podiatry

## 2017-07-17 ENCOUNTER — Ambulatory Visit (INDEPENDENT_AMBULATORY_CARE_PROVIDER_SITE_OTHER): Payer: BC Managed Care – PPO | Admitting: Podiatry

## 2017-07-17 ENCOUNTER — Ambulatory Visit (INDEPENDENT_AMBULATORY_CARE_PROVIDER_SITE_OTHER): Payer: BC Managed Care – PPO

## 2017-07-17 DIAGNOSIS — M205X2 Other deformities of toe(s) (acquired), left foot: Secondary | ICD-10-CM | POA: Diagnosis not present

## 2017-07-17 MED ORDER — CEPHALEXIN 500 MG PO CAPS: 500.0000 mg | ORAL_CAPSULE | Freq: Two times a day (BID) | ORAL | 0 refills | Status: AC

## 2017-07-18 DIAGNOSIS — Z0000 Encounter for general adult medical examination without abnormal findings: Secondary | ICD-10-CM | POA: Diagnosis not present

## 2017-07-18 NOTE — Progress Notes (Signed)
Subjective:   Patient ID: Rose Tran, female   DOB: 56 y.o.   MRN: [id number]   HPI Patient presents stating I am doing fine but I was just concerned because there is a little bit of redness around my incision site.   ROS      Objective:  Physical Exam  Neurovascular status intact with incision left first metatarsal that is doing well with mild redness and no active drainage with stitches intact     Assessment:  Doing well with mild irritation around the incision site left     Plan:  Reviewed condition and x-ray and at this point I have recommended precautionary antibiotic cephalexin 500 mg twice daily.  Stitches removed wound regimen coapted well and bandage applied and gave strict instructions of any redness drainage or other issues to occur she is to reappoint immediately

## 2017-07-25 DIAGNOSIS — Z0000 Encounter for general adult medical examination without abnormal findings: Secondary | ICD-10-CM | POA: Diagnosis not present

## 2017-07-25 DIAGNOSIS — M3500 Sicca syndrome, unspecified: Secondary | ICD-10-CM | POA: Diagnosis not present

## 2017-07-25 DIAGNOSIS — M0600 Rheumatoid arthritis without rheumatoid factor, unspecified site: Secondary | ICD-10-CM | POA: Diagnosis not present

## 2017-07-25 DIAGNOSIS — K900 Celiac disease: Secondary | ICD-10-CM | POA: Diagnosis not present

## 2017-08-26 DIAGNOSIS — Z79899 Other long term (current) drug therapy: Secondary | ICD-10-CM | POA: Diagnosis not present

## 2017-08-26 DIAGNOSIS — M0600 Rheumatoid arthritis without rheumatoid factor, unspecified site: Secondary | ICD-10-CM | POA: Diagnosis not present

## 2017-08-26 DIAGNOSIS — M8580 Other specified disorders of bone density and structure, unspecified site: Secondary | ICD-10-CM | POA: Diagnosis not present

## 2017-08-26 DIAGNOSIS — M3500 Sicca syndrome, unspecified: Secondary | ICD-10-CM | POA: Diagnosis not present

## 2018-06-30 DIAGNOSIS — Z79899 Other long term (current) drug therapy: Secondary | ICD-10-CM | POA: Diagnosis not present

## 2018-06-30 DIAGNOSIS — Z23 Encounter for immunization: Secondary | ICD-10-CM | POA: Diagnosis not present

## 2018-06-30 DIAGNOSIS — M0600 Rheumatoid arthritis without rheumatoid factor, unspecified site: Secondary | ICD-10-CM | POA: Diagnosis not present

## 2018-06-30 DIAGNOSIS — M3500 Sicca syndrome, unspecified: Secondary | ICD-10-CM | POA: Diagnosis not present

## 2018-09-01 DIAGNOSIS — Z0000 Encounter for general adult medical examination without abnormal findings: Secondary | ICD-10-CM | POA: Diagnosis not present

## 2018-09-08 DIAGNOSIS — K900 Celiac disease: Secondary | ICD-10-CM | POA: Diagnosis not present

## 2018-09-08 DIAGNOSIS — M0600 Rheumatoid arthritis without rheumatoid factor, unspecified site: Secondary | ICD-10-CM | POA: Diagnosis not present

## 2018-09-08 DIAGNOSIS — M3500 Sicca syndrome, unspecified: Secondary | ICD-10-CM | POA: Diagnosis not present

## 2018-09-08 DIAGNOSIS — Z0000 Encounter for general adult medical examination without abnormal findings: Secondary | ICD-10-CM | POA: Diagnosis not present

## 2018-09-30 DIAGNOSIS — M0600 Rheumatoid arthritis without rheumatoid factor, unspecified site: Secondary | ICD-10-CM | POA: Diagnosis not present

## 2018-09-30 DIAGNOSIS — M3500 Sicca syndrome, unspecified: Secondary | ICD-10-CM | POA: Diagnosis not present

## 2018-09-30 DIAGNOSIS — M8580 Other specified disorders of bone density and structure, unspecified site: Secondary | ICD-10-CM | POA: Diagnosis not present

## 2018-09-30 DIAGNOSIS — Z79899 Other long term (current) drug therapy: Secondary | ICD-10-CM | POA: Diagnosis not present
